# Patient Record
Sex: Male | Born: 1960 | Race: White | Hispanic: No | Smoking: Current some day smoker
Health system: Southern US, Community
[De-identification: ages and names within clinical notes are randomized; demographics above are authoritative.]

## PROBLEM LIST (undated history)

## (undated) DIAGNOSIS — I10 Essential (primary) hypertension: Secondary | ICD-10-CM

## (undated) DIAGNOSIS — E119 Type 2 diabetes mellitus without complications: Secondary | ICD-10-CM

---

## 2005-03-29 ENCOUNTER — Encounter: Admission: RE | Admit: 2005-03-29 | Discharge: 2005-03-29 | Payer: Self-pay | Admitting: Orthopedic Surgery

## 2014-12-24 ENCOUNTER — Encounter: Admission: RE | Payer: Self-pay | Source: Ambulatory Visit

## 2014-12-24 ENCOUNTER — Ambulatory Visit: Admission: RE | Admit: 2014-12-24 | Payer: 59 | Source: Ambulatory Visit | Admitting: Gastroenterology

## 2014-12-24 SURGERY — COLONOSCOPY WITH PROPOFOL
Anesthesia: General

## 2014-12-31 ENCOUNTER — Encounter: Admission: RE | Payer: Self-pay | Source: Ambulatory Visit

## 2014-12-31 ENCOUNTER — Ambulatory Visit: Admission: RE | Admit: 2014-12-31 | Payer: Self-pay | Source: Ambulatory Visit | Admitting: Gastroenterology

## 2014-12-31 SURGERY — COLONOSCOPY WITH PROPOFOL
Anesthesia: General

## 2016-06-07 DIAGNOSIS — E1165 Type 2 diabetes mellitus with hyperglycemia: Secondary | ICD-10-CM | POA: Diagnosis not present

## 2016-06-07 DIAGNOSIS — R030 Elevated blood-pressure reading, without diagnosis of hypertension: Secondary | ICD-10-CM | POA: Diagnosis not present

## 2016-06-07 DIAGNOSIS — E785 Hyperlipidemia, unspecified: Secondary | ICD-10-CM | POA: Diagnosis not present

## 2016-06-19 DIAGNOSIS — E785 Hyperlipidemia, unspecified: Secondary | ICD-10-CM | POA: Diagnosis not present

## 2016-06-19 DIAGNOSIS — E1165 Type 2 diabetes mellitus with hyperglycemia: Secondary | ICD-10-CM | POA: Diagnosis not present

## 2016-06-19 DIAGNOSIS — R972 Elevated prostate specific antigen [PSA]: Secondary | ICD-10-CM | POA: Diagnosis not present

## 2016-10-03 DIAGNOSIS — E119 Type 2 diabetes mellitus without complications: Secondary | ICD-10-CM | POA: Diagnosis not present

## 2016-10-03 DIAGNOSIS — Z79899 Other long term (current) drug therapy: Secondary | ICD-10-CM | POA: Diagnosis not present

## 2016-10-16 DIAGNOSIS — I10 Essential (primary) hypertension: Secondary | ICD-10-CM | POA: Diagnosis not present

## 2016-10-16 DIAGNOSIS — E1165 Type 2 diabetes mellitus with hyperglycemia: Secondary | ICD-10-CM | POA: Diagnosis not present

## 2016-10-16 DIAGNOSIS — E785 Hyperlipidemia, unspecified: Secondary | ICD-10-CM | POA: Diagnosis not present

## 2017-03-21 DIAGNOSIS — E785 Hyperlipidemia, unspecified: Secondary | ICD-10-CM | POA: Diagnosis not present

## 2017-03-21 DIAGNOSIS — Z1211 Encounter for screening for malignant neoplasm of colon: Secondary | ICD-10-CM | POA: Diagnosis not present

## 2017-03-21 DIAGNOSIS — E1165 Type 2 diabetes mellitus with hyperglycemia: Secondary | ICD-10-CM | POA: Diagnosis not present

## 2017-04-01 DIAGNOSIS — E785 Hyperlipidemia, unspecified: Secondary | ICD-10-CM | POA: Diagnosis not present

## 2017-04-01 DIAGNOSIS — E1165 Type 2 diabetes mellitus with hyperglycemia: Secondary | ICD-10-CM | POA: Diagnosis not present

## 2017-09-11 ENCOUNTER — Emergency Department (HOSPITAL_COMMUNITY)
Admission: EM | Admit: 2017-09-11 | Discharge: 2017-09-11 | Disposition: A | Discharge status: Home / Self Care | Payer: 59 | Attending: Emergency Medicine | Admitting: Emergency Medicine

## 2017-09-11 ENCOUNTER — Encounter (HOSPITAL_COMMUNITY): Payer: Self-pay | Admitting: *Deleted

## 2017-09-11 ENCOUNTER — Other Ambulatory Visit: Payer: Self-pay

## 2017-09-11 ENCOUNTER — Emergency Department (HOSPITAL_COMMUNITY): Payer: 59

## 2017-09-11 DIAGNOSIS — M542 Cervicalgia: Secondary | ICD-10-CM | POA: Diagnosis not present

## 2017-09-11 DIAGNOSIS — Y999 Unspecified external cause status: Secondary | ICD-10-CM | POA: Insufficient documentation

## 2017-09-11 DIAGNOSIS — Y9389 Activity, other specified: Secondary | ICD-10-CM | POA: Insufficient documentation

## 2017-09-11 DIAGNOSIS — F17210 Nicotine dependence, cigarettes, uncomplicated: Secondary | ICD-10-CM | POA: Insufficient documentation

## 2017-09-11 DIAGNOSIS — Y929 Unspecified place or not applicable: Secondary | ICD-10-CM | POA: Diagnosis not present

## 2017-09-11 DIAGNOSIS — I10 Essential (primary) hypertension: Secondary | ICD-10-CM | POA: Diagnosis not present

## 2017-09-11 DIAGNOSIS — S161XXA Strain of muscle, fascia and tendon at neck level, initial encounter: Secondary | ICD-10-CM | POA: Diagnosis not present

## 2017-09-11 DIAGNOSIS — R52 Pain, unspecified: Secondary | ICD-10-CM | POA: Diagnosis not present

## 2017-09-11 DIAGNOSIS — E119 Type 2 diabetes mellitus without complications: Secondary | ICD-10-CM | POA: Diagnosis not present

## 2017-09-11 HISTORY — DX: Essential (primary) hypertension: I10

## 2017-09-11 HISTORY — DX: Type 2 diabetes mellitus without complications: E11.9

## 2017-09-11 MED ORDER — ORPHENADRINE CITRATE ER 100 MG PO TB12: 100.0000 mg | ORAL_TABLET | Freq: Two times a day (BID) | ORAL | 0 refills | Status: AC

## 2017-09-11 MED ORDER — ACETAMINOPHEN 325 MG PO TABS
650.0000 mg | ORAL_TABLET | Freq: Once | ORAL | Status: AC
  Administered 2017-09-11: 650 mg via ORAL
  Filled 2017-09-11: qty 2
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
## 2017-09-11 NOTE — ED Notes (Signed)
Provider made aware of HTN.
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
## 2017-09-16 DIAGNOSIS — I10 Essential (primary) hypertension: Secondary | ICD-10-CM | POA: Diagnosis not present

## 2017-09-16 DIAGNOSIS — E119 Type 2 diabetes mellitus without complications: Secondary | ICD-10-CM | POA: Diagnosis not present

## 2017-09-16 DIAGNOSIS — M62838 Other muscle spasm: Secondary | ICD-10-CM | POA: Diagnosis not present

## 2017-09-16 DIAGNOSIS — M542 Cervicalgia: Secondary | ICD-10-CM | POA: Diagnosis not present

## 2017-10-22 DIAGNOSIS — I10 Essential (primary) hypertension: Secondary | ICD-10-CM | POA: Diagnosis not present

## 2017-10-22 DIAGNOSIS — E785 Hyperlipidemia, unspecified: Secondary | ICD-10-CM | POA: Diagnosis not present

## 2017-10-22 DIAGNOSIS — E119 Type 2 diabetes mellitus without complications: Secondary | ICD-10-CM | POA: Diagnosis not present

## 2017-11-20 DIAGNOSIS — Z79899 Other long term (current) drug therapy: Secondary | ICD-10-CM | POA: Diagnosis not present

## 2017-12-05 DIAGNOSIS — E119 Type 2 diabetes mellitus without complications: Secondary | ICD-10-CM | POA: Diagnosis not present

## 2017-12-05 DIAGNOSIS — E785 Hyperlipidemia, unspecified: Secondary | ICD-10-CM | POA: Diagnosis not present

## 2017-12-10 DIAGNOSIS — E119 Type 2 diabetes mellitus without complications: Secondary | ICD-10-CM | POA: Diagnosis not present

## 2017-12-10 DIAGNOSIS — E785 Hyperlipidemia, unspecified: Secondary | ICD-10-CM | POA: Diagnosis not present

## 2017-12-10 DIAGNOSIS — I10 Essential (primary) hypertension: Secondary | ICD-10-CM | POA: Diagnosis not present

## 2017-12-19 ENCOUNTER — Emergency Department
Admission: EM | Admit: 2017-12-19 | Discharge: 2017-12-20 | Disposition: A | Discharge status: Home / Self Care | Payer: 59 | Attending: Emergency Medicine | Admitting: Emergency Medicine

## 2017-12-19 ENCOUNTER — Other Ambulatory Visit: Payer: Self-pay

## 2017-12-19 DIAGNOSIS — E119 Type 2 diabetes mellitus without complications: Secondary | ICD-10-CM | POA: Insufficient documentation

## 2017-12-19 DIAGNOSIS — T446X5A Adverse effect of alpha-adrenoreceptor antagonists, initial encounter: Secondary | ICD-10-CM | POA: Insufficient documentation

## 2017-12-19 DIAGNOSIS — T887XXA Unspecified adverse effect of drug or medicament, initial encounter: Secondary | ICD-10-CM | POA: Diagnosis not present

## 2017-12-19 DIAGNOSIS — Y69 Unspecified misadventure during surgical and medical care: Secondary | ICD-10-CM | POA: Insufficient documentation

## 2017-12-19 DIAGNOSIS — F17200 Nicotine dependence, unspecified, uncomplicated: Secondary | ICD-10-CM | POA: Diagnosis not present

## 2017-12-19 DIAGNOSIS — R42 Dizziness and giddiness: Secondary | ICD-10-CM | POA: Diagnosis present

## 2017-12-19 DIAGNOSIS — I10 Essential (primary) hypertension: Secondary | ICD-10-CM | POA: Diagnosis not present

## 2017-12-19 DIAGNOSIS — M6281 Muscle weakness (generalized): Secondary | ICD-10-CM | POA: Diagnosis not present

## 2017-12-19 DIAGNOSIS — R110 Nausea: Secondary | ICD-10-CM | POA: Diagnosis not present

## 2017-12-19 DIAGNOSIS — G894 Chronic pain syndrome: Secondary | ICD-10-CM | POA: Diagnosis not present

## 2017-12-19 DIAGNOSIS — I951 Orthostatic hypotension: Secondary | ICD-10-CM

## 2017-12-19 DIAGNOSIS — R55 Syncope and collapse: Secondary | ICD-10-CM | POA: Diagnosis not present

## 2017-12-19 LAB — CBC WITH DIFFERENTIAL/PLATELET
ABS IMMATURE GRANULOCYTES: 0.03 10*3/uL (ref 0.00–0.07)
BASOS ABS: 0 10*3/uL (ref 0.0–0.1)
Basophils Relative: 0 %
Eosinophils Absolute: 0.1 10*3/uL (ref 0.0–0.5)
Eosinophils Relative: 2 %
HEMATOCRIT: 36.7 % — AB (ref 39.0–52.0)
HEMOGLOBIN: 12.4 g/dL — AB (ref 13.0–17.0)
IMMATURE GRANULOCYTES: 0 %
LYMPHS ABS: 2.6 10*3/uL (ref 0.7–4.0)
LYMPHS PCT: 37 %
MCH: 29.2 pg (ref 26.0–34.0)
MCHC: 33.8 g/dL (ref 30.0–36.0)
MCV: 86.4 fL (ref 80.0–100.0)
Monocytes Absolute: 0.8 10*3/uL (ref 0.1–1.0)
Monocytes Relative: 11 %
NEUTROS PCT: 50 %
NRBC: 0 % (ref 0.0–0.2)
Neutro Abs: 3.5 10*3/uL (ref 1.7–7.7)
Platelets: 225 10*3/uL (ref 150–400)
RBC: 4.25 MIL/uL (ref 4.22–5.81)
RDW: 12.6 % (ref 11.5–15.5)
WBC: 7.1 10*3/uL (ref 4.0–10.5)

## 2017-12-19 LAB — COMPREHENSIVE METABOLIC PANEL
ALK PHOS: 44 U/L (ref 38–126)
ALT: 29 U/L (ref 0–44)
AST: 19 U/L (ref 15–41)
Albumin: 4.2 g/dL (ref 3.5–5.0)
Anion gap: 12 (ref 5–15)
BILIRUBIN TOTAL: 0.5 mg/dL (ref 0.3–1.2)
BUN: 18 mg/dL (ref 6–20)
CALCIUM: 9 mg/dL (ref 8.9–10.3)
CHLORIDE: 97 mmol/L — AB (ref 98–111)
CO2: 25 mmol/L (ref 22–32)
CREATININE: 1.6 mg/dL — AB (ref 0.61–1.24)
GFR calc Af Amer: 54 mL/min — ABNORMAL LOW (ref >60)
GFR, EST NON AFRICAN AMERICAN: 47 mL/min — AB (ref >60)
Glucose, Bld: 162 mg/dL — ABNORMAL HIGH (ref 70–99)
Potassium: 3.5 mmol/L (ref 3.5–5.1)
Sodium: 134 mmol/L — ABNORMAL LOW (ref 135–145)
Total Protein: 6.9 g/dL (ref 6.5–8.1)

## 2017-12-19 LAB — ETHANOL: Alcohol, Ethyl (B): 68 mg/dL — ABNORMAL HIGH (ref <10)

## 2017-12-19 LAB — ACETAMINOPHEN LEVEL: Acetaminophen (Tylenol), Serum: <10 ug/mL — ABNORMAL LOW (ref 10–30)

## 2017-12-19 LAB — LIPASE, BLOOD: Lipase: 25 U/L (ref 11–51)

## 2017-12-19 LAB — SALICYLATE LEVEL: Salicylate Lvl: <7 mg/dL (ref 2.8–30.0)

## 2017-12-19 MED ORDER — SODIUM CHLORIDE 0.9 % IV BOLUS
1000.0000 mL | Freq: Once | INTRAVENOUS | Status: AC
Start: 2017-12-19 — End: 2017-12-20
  Administered 2017-12-19: 1000 mL via INTRAVENOUS

## 2017-12-19 MED ORDER — HALOPERIDOL LACTATE 5 MG/ML IJ SOLN
5.0000 mg | Freq: Once | INTRAMUSCULAR | Status: AC
  Administered 2017-12-19: 5 mg via INTRAVENOUS
  Filled 2017-12-19: qty 1

## 2017-12-19 MED ORDER — SODIUM CHLORIDE 0.9 % IV BOLUS
1000.0000 mL | Freq: Once | INTRAVENOUS | Status: AC
  Administered 2017-12-19: 1000 mL via INTRAVENOUS

## 2017-12-19 NOTE — ED Triage Notes (Signed)
Pt arrives to ED via [facility] from home with c/o dizziness. EMS reports pt consumed 5 beers tonight in addition to taking his r/x'd Prazosin (for PTSD). Pt reports (+) nausea, but denies emesis. Pt reports issue with chronic neck pain, but no pain related c/o's at this time. Pt is A&O, in NAD; RR even, regular, and unlabored.
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
## 2017-12-20 MED ORDER — KETAMINE HCL 10 MG/ML IJ SOLN: 0.3000 mg/kg | Freq: Once | INTRAMUSCULAR | Status: DC

## 2017-12-20 MED ORDER — HYDROCODONE-ACETAMINOPHEN 5-325 MG PO TABS: 1.0000 | ORAL_TABLET | Freq: Four times a day (QID) | ORAL | 0 refills | Status: AC | PRN

## 2017-12-20 MED ORDER — MIDODRINE HCL 5 MG PO TABS
10.0000 mg | ORAL_TABLET | Freq: Once | ORAL | Status: AC
  Administered 2017-12-20: 10 mg via ORAL
  Filled 2017-12-20: qty 2

## 2017-12-20 MED ORDER — HYDROCODONE-ACETAMINOPHEN 5-325 MG PO TABS
2.0000 | ORAL_TABLET | Freq: Once | ORAL | Status: AC
  Administered 2017-12-20: 2 via ORAL
  Filled 2017-12-20: qty 2

## 2017-12-20 MED ORDER — GABAPENTIN 300 MG PO CAPS
900.0000 mg | ORAL_CAPSULE | Freq: Once | ORAL | Status: AC
  Administered 2017-12-20: 900 mg via ORAL
  Filled 2017-12-20: qty 3

## 2017-12-20 NOTE — Discharge Instructions (Addendum)
DO NOT TAKE EITHER YOUR PRAZOSIN OR YOUR LISINOPRIL UNTIL YOU SEE YOUR PMD  I am glad you are feeling better.  Today it looks like your low blood pressure and dizziness is because of your prazosin and possibly from your lisinopril.  Please follow-up with your primary care physician this coming Monday and make sure you remain well-hydrated.  Return to the emergency department sooner for any concerns whatsoever.  It was a pleasure to take care of you today, and thank you for coming to our emergency department.  If you have any questions or concerns before leaving please ask the nurse to grab me and I'm more than happy to go through your aftercare instructions again.  If you were prescribed any opioid pain medication today such as Norco, Vicodin, Percocet, morphine, hydrocodone, or oxycodone please make sure you do not drive when you are taking this medication as it can alter your ability to drive safely.  If you have any concerns once you are home that you are not improving or are in fact getting worse before you can make it to your follow-up appointment, please do not hesitate to call [phone] and come back for further evaluation.  Merrily Brittle, MD  Results for orders placed or performed during the hospital encounter of 12/19/17  Acetaminophen level  Result Value Ref Range   Acetaminophen (Tylenol), Serum <10 (L) 10 - 30 ug/mL  Ethanol  Result Value Ref Range   Alcohol, Ethyl (B) 68 (H) <10 mg/dL  Comprehensive metabolic panel  Result Value Ref Range   Sodium 134 (L) 135 - 145 mmol/L   Potassium 3.5 3.5 - 5.1 mmol/L   Chloride 97 (L) 98 - 111 mmol/L   CO2 25 22 - 32 mmol/L   Glucose, Bld 162 (H) 70 - 99 mg/dL   BUN 18 6 - 20 mg/dL   Creatinine, Ser 9.60 (H) 0.61 - 1.24 mg/dL   Calcium 9.0 8.9 - 45.4 mg/dL   Total Protein 6.9 6.5 - 8.1 g/dL   Albumin 4.2 3.5 - 5.0 g/dL   AST 19 15 - 41 U/L   ALT 29 0 - 44 U/L   Alkaline Phosphatase 44 38 - 126 U/L   Total Bilirubin 0.5 0.3 - 1.2 mg/dL   GFR calc non Af Amer 47 (L) >60 mL/min   GFR calc Af Amer 54 (L) >60 mL/min   Anion gap 12 5 - 15  Salicylate level  Result Value Ref Range   Salicylate Lvl <7.0 2.8 - 30.0 mg/dL  CBC with Differential  Result Value Ref Range   WBC 7.1 4.0 - 10.5 K/uL   RBC 4.25 4.22 - 5.81 MIL/uL   Hemoglobin 12.4 (L) 13.0 - 17.0 g/dL   HCT 09.8 (L) 11.9 - 14.7 %   MCV 86.4 80.0 - 100.0 fL   MCH 29.2 26.0 - 34.0 pg   MCHC 33.8 30.0 - 36.0 g/dL   RDW 82.9 56.2 - 13.0 %   Platelets 225 150 - 400 K/uL   nRBC 0.0 0.0 - 0.2 %   Neutrophils Relative % 50 %   Neutro Abs 3.5 1.7 - 7.7 K/uL   Lymphocytes Relative 37 %   Lymphs Abs 2.6 0.7 - 4.0 K/uL   Monocytes Relative 11 %   Monocytes Absolute 0.8 0.1 - 1.0 K/uL   Eosinophils Relative 2 %   Eosinophils Absolute 0.1 0.0 - 0.5 K/uL   Basophils Relative 0 %   Basophils Absolute 0.0 0.0 - 0.1 K/uL  Immature Granulocytes 0 %   Abs Immature Granulocytes 0.03 0.00 - 0.07 K/uL  Lipase, blood  Result Value Ref Range   Lipase 25 11 - 51 U/L

## 2019-11-16 ENCOUNTER — Other Ambulatory Visit: Payer: Self-pay

## 2019-11-16 ENCOUNTER — Emergency Department
Admission: EM | Admit: 2019-11-16 | Discharge: 2019-11-16 | Disposition: A | Discharge status: Home / Self Care | Payer: Commercial Managed Care - PPO | Attending: Emergency Medicine | Admitting: Emergency Medicine

## 2019-11-16 ENCOUNTER — Emergency Department: Payer: Commercial Managed Care - PPO

## 2019-11-16 DIAGNOSIS — F17200 Nicotine dependence, unspecified, uncomplicated: Secondary | ICD-10-CM | POA: Insufficient documentation

## 2019-11-16 DIAGNOSIS — N3091 Cystitis, unspecified with hematuria: Secondary | ICD-10-CM | POA: Diagnosis not present

## 2019-11-16 DIAGNOSIS — Z79899 Other long term (current) drug therapy: Secondary | ICD-10-CM | POA: Insufficient documentation

## 2019-11-16 DIAGNOSIS — E119 Type 2 diabetes mellitus without complications: Secondary | ICD-10-CM | POA: Diagnosis not present

## 2019-11-16 DIAGNOSIS — I10 Essential (primary) hypertension: Secondary | ICD-10-CM | POA: Diagnosis not present

## 2019-11-16 DIAGNOSIS — R310 Gross hematuria: Secondary | ICD-10-CM

## 2019-11-16 DIAGNOSIS — N3090 Cystitis, unspecified without hematuria: Secondary | ICD-10-CM

## 2019-11-16 DIAGNOSIS — R319 Hematuria, unspecified: Secondary | ICD-10-CM | POA: Diagnosis present

## 2019-11-16 LAB — URINALYSIS, COMPLETE (UACMP) WITH MICROSCOPIC
Bacteria, UA: NONE SEEN
RBC / HPF: >50 RBC/hpf — ABNORMAL HIGH (ref 0–5)
Specific Gravity, Urine: 1.021 (ref 1.005–1.030)
Squamous Epithelial / LPF: NONE SEEN (ref 0–5)

## 2019-11-16 LAB — CBC
HCT: 37.9 % — ABNORMAL LOW (ref 39.0–52.0)
Hemoglobin: 13.1 g/dL (ref 13.0–17.0)
MCH: 29.4 pg (ref 26.0–34.0)
MCHC: 34.6 g/dL (ref 30.0–36.0)
MCV: 85 fL (ref 80.0–100.0)
Platelets: 216 10*3/uL (ref 150–400)
RBC: 4.46 MIL/uL (ref 4.22–5.81)
RDW: 13 % (ref 11.5–15.5)
WBC: 15.4 10*3/uL — ABNORMAL HIGH (ref 4.0–10.5)
nRBC: 0 % (ref 0.0–0.2)

## 2019-11-16 LAB — BASIC METABOLIC PANEL
Anion gap: 10 (ref 5–15)
BUN: 17 mg/dL (ref 6–20)
CO2: 26 mmol/L (ref 22–32)
Calcium: 9.4 mg/dL (ref 8.9–10.3)
Chloride: 99 mmol/L (ref 98–111)
Creatinine, Ser: 1.28 mg/dL — ABNORMAL HIGH (ref 0.61–1.24)
GFR calc Af Amer: >60 mL/min (ref >60)
GFR calc non Af Amer: >60 mL/min (ref >60)
Glucose, Bld: 163 mg/dL — ABNORMAL HIGH (ref 70–99)
Potassium: 4.2 mmol/L (ref 3.5–5.1)
Sodium: 135 mmol/L (ref 135–145)

## 2019-11-16 MED ORDER — LACTATED RINGERS IV BOLUS: 1000.0000 mL | Freq: Once | INTRAVENOUS | Status: DC

## 2019-11-16 MED ORDER — CEPHALEXIN 500 MG PO CAPS: 500.0000 mg | ORAL_CAPSULE | Freq: Two times a day (BID) | ORAL | 0 refills | Status: AC

## 2019-11-16 MED ORDER — SODIUM CHLORIDE 0.9 % IV BOLUS
1000.0000 mL | Freq: Once | INTRAVENOUS | Status: AC
  Administered 2019-11-16: 1000 mL via INTRAVENOUS

## 2019-11-16 MED ORDER — SODIUM CHLORIDE 0.9 % IV SOLN
1.0000 g | Freq: Once | INTRAVENOUS | Status: AC
  Administered 2019-11-16: 1 g via INTRAVENOUS
  Filled 2019-11-16: qty 10

## 2019-11-16 NOTE — ED Notes (Signed)
IV therapy initiated by this RN, medications administered per order. Pt visualized in NAD. Pt sitting on bench seat in room per his request for comfort. Explained will receive medications and go for CT as ordered by EDP. Pt states understanding. Denies further needs. Pt remains A&O x4.

## 2019-11-16 NOTE — ED Triage Notes (Signed)
Pt c/o lower abd pain since yesterday and increased urinary frequency with blood in his urine today.

## 2019-11-16 NOTE — ED Notes (Signed)
Pt c/o hematuria that started this morning. Pt c/o some dysuria and frequency that started several days ago. Pt denies hx of kidney stones, denies hx of prostate problems. Pt A&O x4, NAD noted on assessment.

## 2019-11-16 NOTE — ED Notes (Signed)
NAD noted at time of D/C. Pt denies questions or concerns. Pt ambulatory to the lobby at this time.  

## 2019-11-16 NOTE — ED Provider Notes (Signed)
[facility] Emergency Department Provider Note   ____________________________________________   First MD Initiated Contact with Patient 11/16/19 1138     (approximate)  I have reviewed the triage vital signs and the nursing notes.   HISTORY  Chief Complaint Abdominal Pain and Hematuria    HPI Brad Buchanan is a 59 y.o. male with past medical history of hypertension and diabetes who presents to the ED complaining of hematuria.  Patient reports that he developed frequent urination with only passing a small amount of urine each time starting yesterday.  Earlier today, he started to notice blood in his urine and has passed some very small clots at times.  He does not feel like he is having difficulty fully emptying his bladder and denies any significant abdominal pain or flank pain.  He denies any fevers, chills nausea, vomiting, dysuria, or penile pain.  He has never had similar symptoms in the past and does not take anticoagulation.        Past Medical History:  Diagnosis Date  . Diabetes mellitus without complication (HCC)   . Hypertension     There are no problems to display for this patient.   History reviewed. No pertinent surgical history.  Prior to Admission medications   Medication Sig Start Date End Date Taking? Authorizing Provider  cephALEXin (KEFLEX) 500 MG capsule Take 1 capsule (500 mg total) by mouth 2 (two) times daily for 7 days. 11/16/19 11/23/19  Chesley Noon, MD  HYDROcodone-acetaminophen (NORCO) 5-325 MG tablet Take 1 tablet by mouth every 6 (six) hours as needed for up to 15 doses for severe pain. 12/20/17   Merrily Brittle, MD    Allergies Patient has no known allergies.  No family history on file.  Social History Social History   Tobacco Use  . Smoking status: Current Some Day Smoker  . Smokeless tobacco: Never Used  Substance Use Topics  . Alcohol use: Yes    Comment: occasionally  . Drug use: Never    Review  of Systems  Constitutional: No fever/chills Eyes: No visual changes. ENT: No sore throat. Cardiovascular: Denies chest pain. Respiratory: Denies shortness of breath. Gastrointestinal: No abdominal pain.  No nausea, no vomiting.  No diarrhea.  No constipation. Genitourinary: Negative for dysuria.  Positive for urinary frequency and hematuria. Musculoskeletal: Negative for back pain. Skin: Negative for rash. Neurological: Negative for headaches, focal weakness or numbness.  ____________________________________________   PHYSICAL EXAM:  VITAL SIGNS: ED Triage Vitals  Enc Vitals Group     BP 11/16/19 0824 140/81     Pulse Rate 11/16/19 0824 100     Resp 11/16/19 0824 18     Temp 11/16/19 0824 100.3 F (37.9 C)     Temp Source 11/16/19 0824 Oral     SpO2 11/16/19 0824 96 %     Weight 11/16/19 0838 228 lb (103.4 kg)     Height 11/16/19 0838 5\' 7"  (1.702 m)     Head Circumference --      Peak Flow --      Pain Score 11/16/19 0838 0     Pain Loc --      Pain Edu? --      Excl. in GC? --     Constitutional: Alert and oriented. Eyes: Conjunctivae are normal. Head: Atraumatic. Nose: No congestion/rhinnorhea. Mouth/Throat: Mucous membranes are moist. Neck: Normal ROM Cardiovascular: Normal rate, regular rhythm. Grossly normal heart sounds. Respiratory: Normal respiratory effort.  No retractions. Lungs CTAB. Gastrointestinal: Soft and  nontender. No distention.  No CVA tenderness bilaterally. Genitourinary: deferred Musculoskeletal: No lower extremity tenderness nor edema. Neurologic:  Normal speech and language. No gross focal neurologic deficits are appreciated. Skin:  Skin is warm, dry and intact. No rash noted. Psychiatric: Mood and affect are normal. Speech and behavior are normal.  ____________________________________________   LABS (all labs ordered are listed, but only abnormal results are displayed)  Labs Reviewed  URINALYSIS, COMPLETE (UACMP) WITH MICROSCOPIC -  Abnormal; Notable for the following components:      Result Value   Color, Urine RED (*)    APPearance TURBID (*)    Glucose, UA   (*)    Value: TEST NOT REPORTED DUE TO COLOR INTERFERENCE OF URINE PIGMENT   Hgb urine dipstick   (*)    Value: TEST NOT REPORTED DUE TO COLOR INTERFERENCE OF URINE PIGMENT   Bilirubin Urine   (*)    Value: TEST NOT REPORTED DUE TO COLOR INTERFERENCE OF URINE PIGMENT   Ketones, ur   (*)    Value: TEST NOT REPORTED DUE TO COLOR INTERFERENCE OF URINE PIGMENT   Protein, ur   (*)    Value: TEST NOT REPORTED DUE TO COLOR INTERFERENCE OF URINE PIGMENT   Nitrite   (*)    Value: TEST NOT REPORTED DUE TO COLOR INTERFERENCE OF URINE PIGMENT   Leukocytes,Ua   (*)    Value: TEST NOT REPORTED DUE TO COLOR INTERFERENCE OF URINE PIGMENT   RBC / HPF >50 (*)    All other components within normal limits  BASIC METABOLIC PANEL - Abnormal; Notable for the following components:   Glucose, Bld 163 (*)    Creatinine, Ser 1.28 (*)    All other components within normal limits  CBC - Abnormal; Notable for the following components:   WBC 15.4 (*)    HCT 37.9 (*)    All other components within normal limits  URINE CULTURE    PROCEDURES  Procedure(s) performed (including Critical Care):  Procedures   ____________________________________________   INITIAL IMPRESSION / ASSESSMENT AND PLAN / ED COURSE       59 year old male with past medical history of hypertension and diabetes presents to the ED complaining of urinary frequency starting yesterday and gross hematuria starting today.  He overall appears well but has borderline fever at 100.3 and elevated WBC.  I suspect his symptoms are related to UTI although urine sample is grossly bloody and urinalysis is limited due to this.  We will check CT abdomen/pelvis to ensure no contributing nephrolithiasis or mass, hydrate with IV fluids and give initial dose of IV Rocephin.  Remainder of lab work is reassuring.  CT scan is  negative for nephrolithiasis or mass, does show bladder wall thickening consistent with lower UTI.  Patient remains well-appearing and received initial dose of Rocephin here in the ED, urine sent for culture.  He is appropriate for discharge home with urology follow-up, will be prescribed Keflex to treat UTI.  He was counseled to return to the ED for new worsening symptoms, patient agrees with plan.      ____________________________________________   FINAL CLINICAL IMPRESSION(S) / ED DIAGNOSES  Final diagnoses:  Cystitis  Gross hematuria     ED Discharge Orders         Ordered    cephALEXin (KEFLEX) 500 MG capsule  2 times daily        11/16/19 1303           Note:  This document  was prepared using Conservation officer, historic buildings and may include unintentional dictation errors.   Chesley Noon, MD 11/16/19 [phone]

## 2019-11-18 LAB — URINE CULTURE: Culture: >=100000 — AB

## 2021-05-28 IMAGING — CT CT RENAL STONE PROTOCOL
2 of 4 series · 16 of 46 positions shown, 18 images · non-contrast
Comparison: None.

CLINICAL DATA: Pain and hematuria

EXAM:
CT ABDOMEN AND PELVIS WITHOUT CONTRAST
TECHNIQUE: Multidetector CT imaging of the abdomen and pelvis was performed
following the standard protocol without oral or IV contrast.

[Series 2: stone full standard · axial · 0.81mm/px · z∈[+14,+434]mm · 13 of 93 slices shown, 15 images]
[im 5/93  soft-tissue]
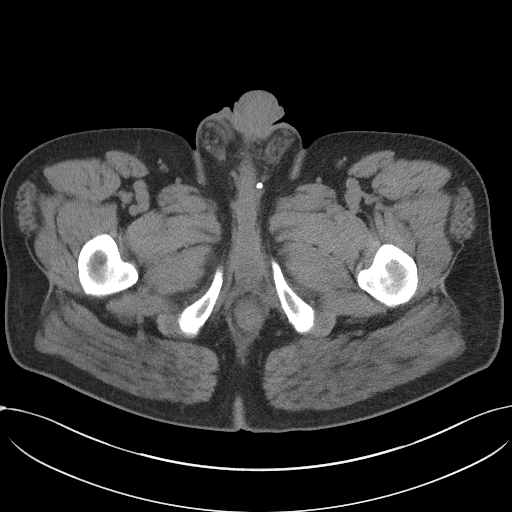
[im 5/93  bone]
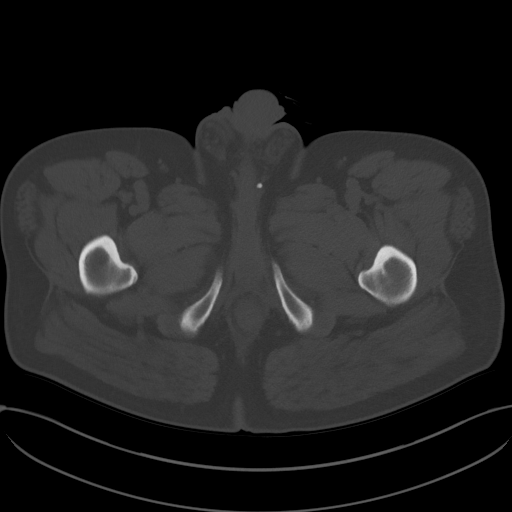
[im 13/93  soft-tissue]
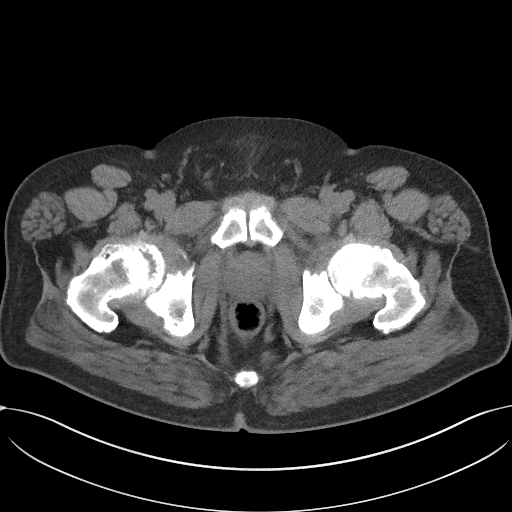
[im 21/93  soft-tissue]
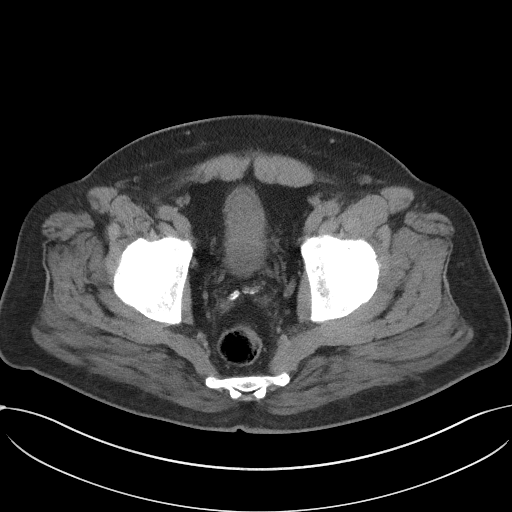
[im 25/93  soft-tissue]
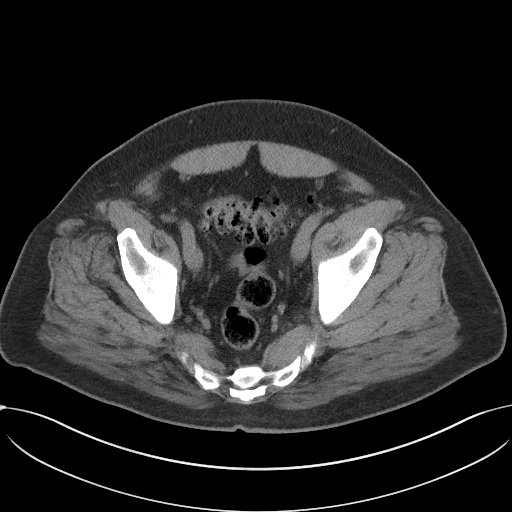
[im 33/93  soft-tissue]
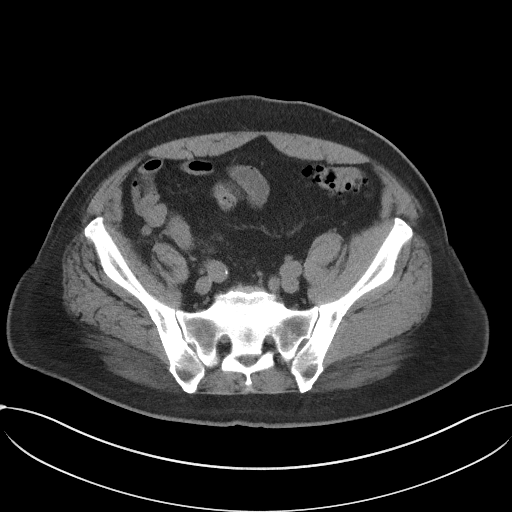
[im 41/93  soft-tissue]
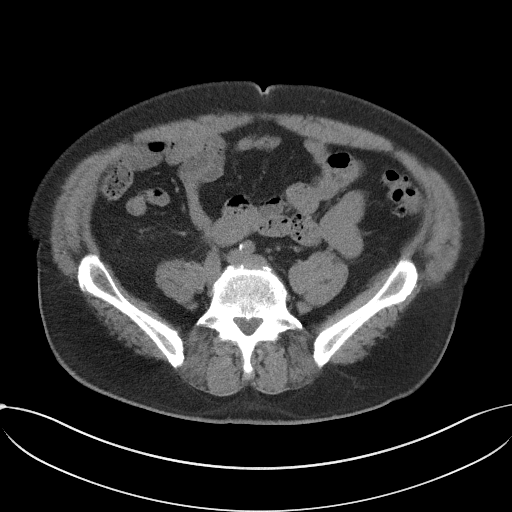
[im 49/93  soft-tissue]
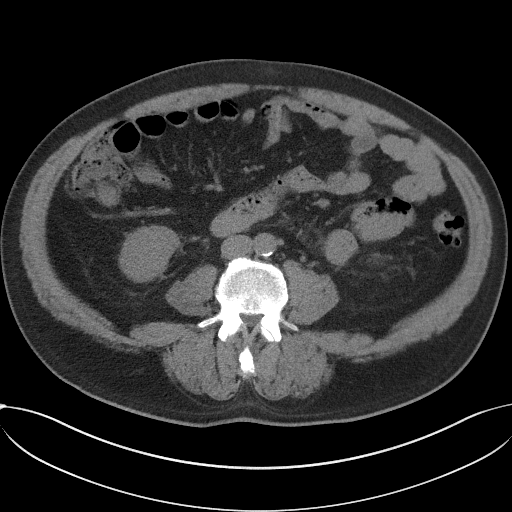
[im 53/93  soft-tissue]
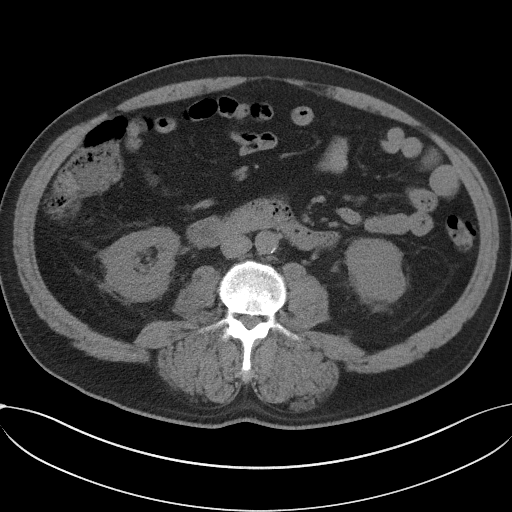
[im 61/93  soft-tissue]
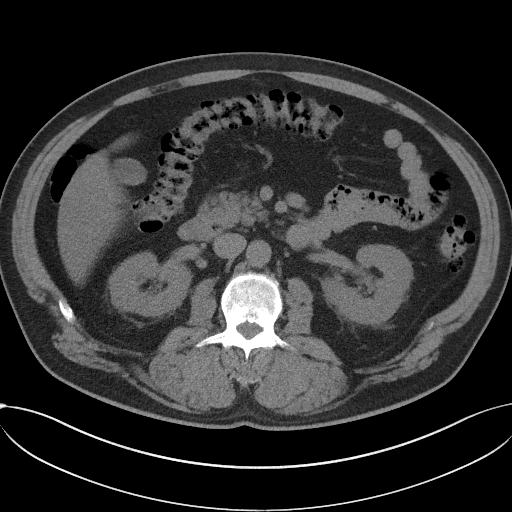
[im 61/93  bone]
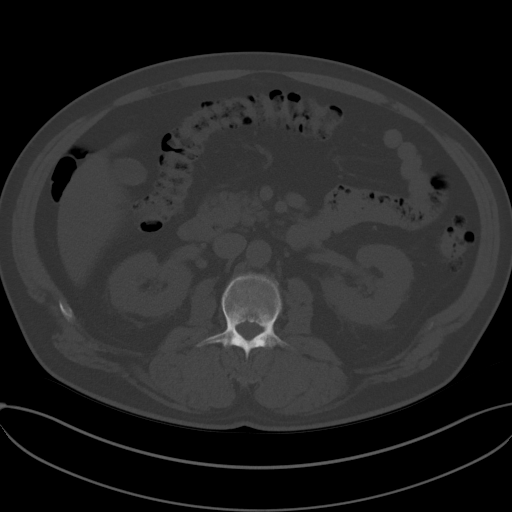
[im 69/93  soft-tissue]
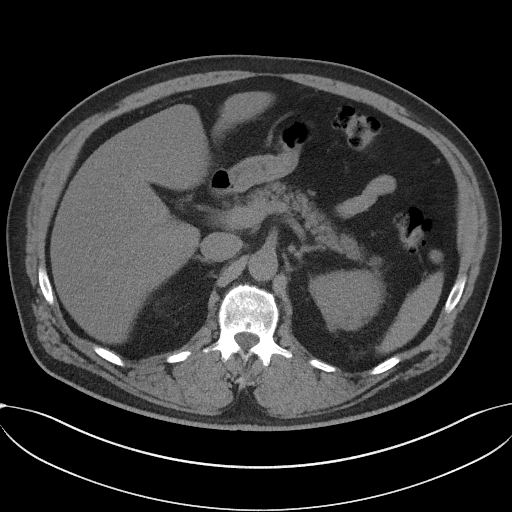
[im 73/93  soft-tissue]
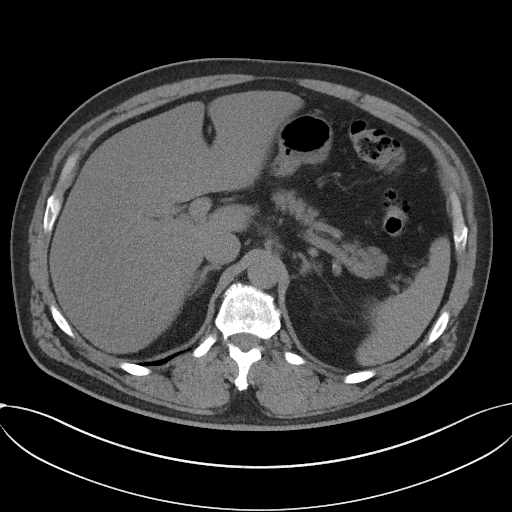
[im 81/93  soft-tissue]
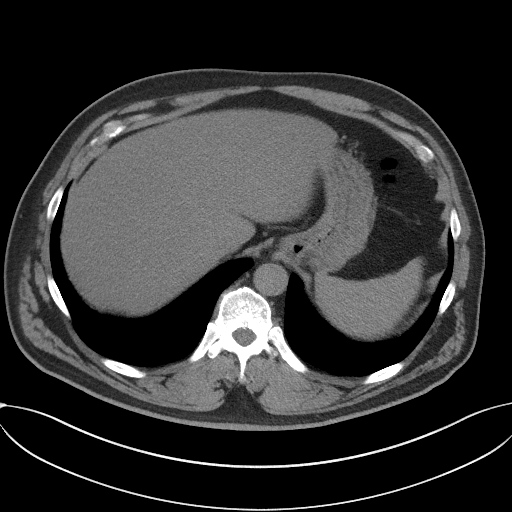
[im 89/93  soft-tissue]
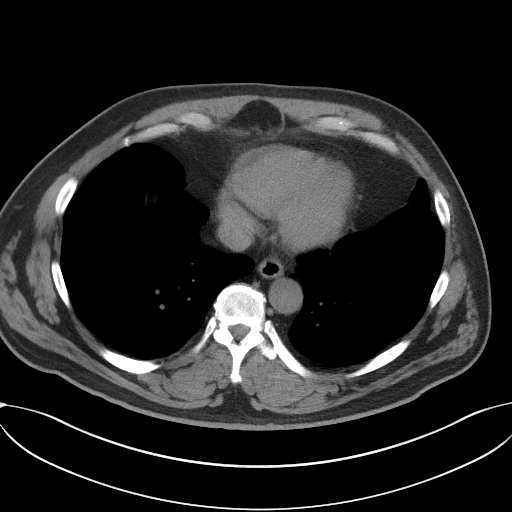

[Series 5: coronal · coronal · 0.86mm/px · 3 of 147 slices shown]
[im 49/147  soft-tissue]
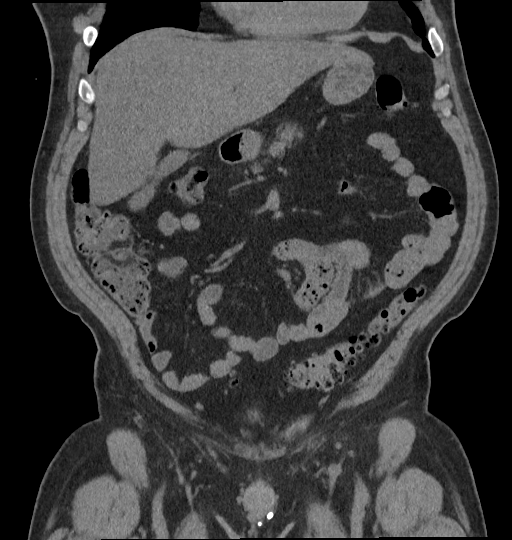
[im 65/147  soft-tissue]
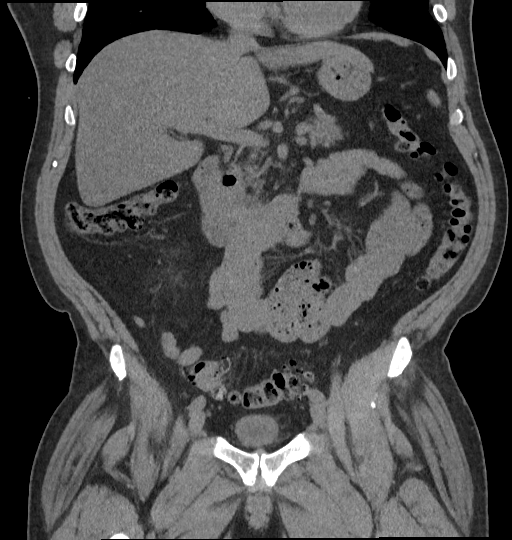
[im 82/147  soft-tissue]
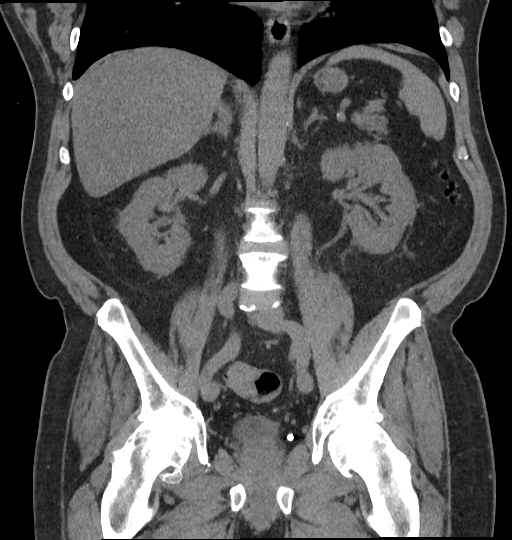

[16 of 46 positions shown; findings below may reference images not displayed]

FINDINGS: Lower chest: Lung bases are clear.

Hepatobiliary: There is hepatic steatosis. No focal liver lesions
are evident on this noncontrast enhanced study. The gallbladder wall
is not appreciably thickened. There is no evident biliary duct
dilatation.

Pancreas: There is no pancreatic mass or inflammatory focus.

Spleen: No splenic lesions are evident.

Adrenals/Urinary Tract: Adrenals bilaterally appear normal. There is
no appreciable renal mass or hydronephrosis on either side. There is
no appreciable renal or ureteral calculus on either side. There is a
degree of urinary bladder wall thickening.

Stomach/Bowel: There are multiple descending colonic and sigmoid
diverticula without evident diverticulitis. There is no bowel wall
or mesenteric thickening. The terminal ileum appears normal. There
is fatty infiltration in the ileocecal valve. There is no
appreciable bowel obstruction. There is no appreciable free air or
portal venous air.

Vascular/Lymphatic: There is no abdominal aortic aneurysm. There is
aortic and common iliac artery atherosclerotic calcification. There
is no demonstrable adenopathy in the abdomen or pelvis.

Reproductive: Prostate and seminal vesicles are normal in size and
contour. Foci of vas deferens calcification noted bilaterally.

Other: Appendix appears normal. No evident abscess or ascites in
abdomen or pelvis.

Musculoskeletal: No blastic or lytic bone lesions. There are foci of
degenerative change lumbar spine and hip joint regions. There is
slightly greater narrowing of the right hip joint and left hip
joint. There is a probable bone island in the left lateral femoral
neck region. There are no apparent blastic or lytic bone lesions. No
intramuscular or abdominal wall lesions evident.
IMPRESSION: 1. Apparent thickening of the urinary bladder wall. There may be a
degree of cystitis. Correlation with urinalysis advised in this
regard.

2. No appreciable renal or ureteral calculus. No evident
hydronephrosis on either side.

3. No bowel wall thickening or bowel obstruction. Extensive
left-sided colonic diverticulosis without diverticulitis evident.

4.  No abscess in the abdomen or pelvis.  Appendix appears normal.

5.  Hepatic steatosis.

6.  Aortic Atherosclerosis ([id number]-MGM.M).

## 2023-05-09 ENCOUNTER — Other Ambulatory Visit: Payer: Self-pay

## 2023-05-09 DIAGNOSIS — E119 Type 2 diabetes mellitus without complications: Secondary | ICD-10-CM | POA: Diagnosis not present

## 2023-05-09 DIAGNOSIS — S058X2A Other injuries of left eye and orbit, initial encounter: Secondary | ICD-10-CM | POA: Diagnosis not present

## 2023-05-09 DIAGNOSIS — I10 Essential (primary) hypertension: Secondary | ICD-10-CM | POA: Insufficient documentation

## 2023-05-09 DIAGNOSIS — Z23 Encounter for immunization: Secondary | ICD-10-CM | POA: Diagnosis not present

## 2023-05-09 DIAGNOSIS — S0590XA Unspecified injury of unspecified eye and orbit, initial encounter: Secondary | ICD-10-CM | POA: Diagnosis present

## 2023-05-09 MED ORDER — FLUORESCEIN SODIUM 1 MG OP STRP
1.0000 | ORAL_STRIP | Freq: Once | OPHTHALMIC | Status: AC
  Administered 2023-05-10: 1 via OPHTHALMIC
  Filled 2023-05-09: qty 1

## 2023-05-09 MED ORDER — TETRACAINE HCL 0.5 % OP SOLN
2.0000 [drp] | Freq: Once | OPHTHALMIC | Status: AC
  Administered 2023-05-10: 2 [drp] via OPHTHALMIC
  Filled 2023-05-09: qty 4
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
## 2023-05-10 ENCOUNTER — Emergency Department
Admission: EM | Admit: 2023-05-10 | Discharge: 2023-05-10 | Disposition: A | Discharge status: Home / Self Care | Payer: Self-pay | Attending: Emergency Medicine | Admitting: Emergency Medicine

## 2023-05-10 DIAGNOSIS — S058X2A Other injuries of left eye and orbit, initial encounter: Secondary | ICD-10-CM

## 2023-05-10 MED ORDER — TETANUS-DIPHTH-ACELL PERTUSSIS 5-2.5-18.5 LF-MCG/0.5 IM SUSY
0.5000 mL | PREFILLED_SYRINGE | Freq: Once | INTRAMUSCULAR | Status: AC
  Administered 2023-05-10: 0.5 mL via INTRAMUSCULAR
  Filled 2023-05-10: qty 0.5

## 2023-05-10 MED ORDER — CIPROFLOXACIN HCL 0.3 % OP SOLN
2.0000 [drp] | Freq: Once | OPHTHALMIC | Status: AC
  Administered 2023-05-10: 2 [drp] via OPHTHALMIC
  Filled 2023-05-10: qty 2.5

## 2023-05-10 NOTE — Discharge Instructions (Addendum)
 Go to [facility] for an appointment this morning.  They open at 7:45 AM.  They will be expecting you this morning.  Get some sleep tonight and when you wake up apply 2 drops of the antibiotic eyedrops before going to the doctor's appointment.  When you get to the eye doctor they will do an examination and instruct you further for medications needed thereafter.  Take acetaminophen 650 mg and ibuprofen 400 mg every 6 hours for pain.  Take with food.   Thank you for choosing [place] for your health care today!  Please see your primary doctor this week for a follow up appointment.   If you have any new, worsening, or unexpected symptoms call your doctor right away or come back to the emergency department for reevaluation.  It was my pleasure to care for you today.   Daneil Dan Modesto Charon, MD

## 2023-05-10 NOTE — ED Notes (Signed)
 Pt declined VS at discharge
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
